# Patient Record
Sex: Male | Born: 1965 | Race: White | Hispanic: No | Marital: Married | Smoking: Never smoker
Health system: Southern US, Community
[De-identification: ages and names within clinical notes are randomized; demographics above are authoritative.]

## PROBLEM LIST (undated history)

## (undated) DIAGNOSIS — M1990 Unspecified osteoarthritis, unspecified site: Secondary | ICD-10-CM

---

## 2012-12-04 ENCOUNTER — Ambulatory Visit: Payer: Self-pay | Admitting: Internal Medicine

## 2017-02-02 ENCOUNTER — Encounter: Payer: Self-pay | Admitting: Emergency Medicine

## 2017-02-02 ENCOUNTER — Emergency Department: Payer: BLUE CROSS/BLUE SHIELD

## 2017-02-02 ENCOUNTER — Emergency Department
Admission: EM | Admit: 2017-02-02 | Discharge: 2017-02-02 | Disposition: A | Discharge status: Home / Self Care | Payer: BLUE CROSS/BLUE SHIELD | Attending: Emergency Medicine | Admitting: Emergency Medicine

## 2017-02-02 DIAGNOSIS — N5082 Scrotal pain: Secondary | ICD-10-CM | POA: Insufficient documentation

## 2017-02-02 DIAGNOSIS — N503 Cyst of epididymis: Secondary | ICD-10-CM | POA: Diagnosis not present

## 2017-02-02 DIAGNOSIS — Z79899 Other long term (current) drug therapy: Secondary | ICD-10-CM | POA: Diagnosis not present

## 2017-02-02 DIAGNOSIS — N433 Hydrocele, unspecified: Secondary | ICD-10-CM | POA: Insufficient documentation

## 2017-02-02 DIAGNOSIS — N50811 Right testicular pain: Secondary | ICD-10-CM | POA: Diagnosis present

## 2017-02-02 HISTORY — DX: Unspecified osteoarthritis, unspecified site: M19.90

## 2017-02-02 MED ORDER — CIPROFLOXACIN HCL 500 MG PO TABS: 500.0000 mg | ORAL_TABLET | Freq: Two times a day (BID) | ORAL | 0 refills | Status: AC

## 2017-02-02 MED ORDER — NAPROXEN 500 MG PO TABS: 500.0000 mg | ORAL_TABLET | Freq: Two times a day (BID) | ORAL | 0 refills | Status: AC

## 2017-02-02 NOTE — Discharge Instructions (Signed)
Your ultrasound shows an epididymal cyst, which is a small fluid collection in the tissue behind the testicle.  Follow up with urology for further evaluation. Take cipro in the meantime.  Take NSAIDS like naproxen for pain control.  It may help to use a scrotal support as well.   No results found for this or any previous visit. [place] Scrotum  Result Date: 02/02/2017 CLINICAL DATA:  Worsening right scrotal pain and swelling over the past 3 days. EXAM: SCROTAL ULTRASOUND DOPPLER ULTRASOUND OF THE TESTICLES TECHNIQUE: Complete ultrasound examination of the testicles, epididymis, and other scrotal structures was performed. Color and spectral Doppler ultrasound were also utilized to evaluate blood flow to the testicles. COMPARISON:  None. FINDINGS: Right testicle Measurements: 4.1 x 2.5 x 1.9 cm. No mass or microlithiasis visualized. Left testicle Measurements: 4.3 x 2.5 x 1.8 cm. No mass or microlithiasis visualized. Right epididymis: 5 mm cyst containing thin internal septations in the head of the epididymis on the right. Left epididymis:  Normal in size and appearance. Hydrocele:  Small right hydrocele. Varicocele:  None visualized. Pulsed Doppler interrogation of both testes demonstrates normal low resistance arterial and venous waveforms bilaterally. IMPRESSION: 1. Small right epididymal cyst. 2. Small right hydrocele. 3. Otherwise, unremarkable examination. Electronically Signed   By: Beckie SaltsSteven  Reid M.D.   On: 02/02/2017 18:03   [place] Art/ven Flow Abd Pelv Doppler  Result Date: 02/02/2017 CLINICAL DATA:  Worsening right scrotal pain and swelling over the past 3 days. EXAM: SCROTAL ULTRASOUND DOPPLER ULTRASOUND OF THE TESTICLES TECHNIQUE: Complete ultrasound examination of the testicles, epididymis, and other scrotal structures was performed. Color and spectral Doppler ultrasound were also utilized to evaluate blood flow to the testicles. COMPARISON:  None. FINDINGS: Right testicle Measurements: 4.1 x 2.5 x 1.9 cm.  No mass or microlithiasis visualized. Left testicle Measurements: 4.3 x 2.5 x 1.8 cm. No mass or microlithiasis visualized. Right epididymis: 5 mm cyst containing thin internal septations in the head of the epididymis on the right. Left epididymis:  Normal in size and appearance. Hydrocele:  Small right hydrocele. Varicocele:  None visualized. Pulsed Doppler interrogation of both testes demonstrates normal low resistance arterial and venous waveforms bilaterally. IMPRESSION: 1. Small right epididymal cyst. 2. Small right hydrocele. 3. Otherwise, unremarkable examination. Electronically Signed   By: Beckie SaltsSteven  Reid M.D.   On: 02/02/2017 18:03

## 2017-02-02 NOTE — ED Provider Notes (Signed)
[facility] Emergency Department Provider Note  ____________________________________________  Time seen: Approximately 7:05 PM  I have reviewed the triage vital signs and the nursing notes.   HISTORY  Chief Complaint Testicle Pain    HPI Derrick Carr is a 51 y.o. male who complains of right-sided scrotal pain for the past 4-5 days, radiates into the right thigh. Worse with movement. No alleviating factors. No dysuria frequency urgency hematuria or unusual discharge. No intense straining or exertion recently. No vomiting. Eating and drinking normally. No abdominal pain. Pain is sharp. Never had anything like this before.     Past Medical History:  Diagnosis Date  . Arthritis      There are no active problems to display for this patient.    History reviewed. No pertinent surgical history.   Prior to Admission medications   Medication Sig Start Date End Date Taking? Authorizing Provider  ibuprofen (ADVIL,MOTRIN) 200 MG tablet Take 800 mg by mouth 2 (two) times daily.   Yes [provider]  minocycline (MINOCIN,DYNACIN) 100 MG capsule 100 mg 2 (two) times daily.  01/22/17  Yes [provider]  omeprazole (PRILOSEC) 10 MG capsule Take 10 mg by mouth daily.   Yes [provider]  ciprofloxacin (CIPRO) 500 MG tablet Take 1 tablet (500 mg total) by mouth 2 (two) times daily. 02/02/17   Sharman CheekStafford, Jozef Eisenbeis, MD  naproxen (NAPROSYN) 500 MG tablet Take 1 tablet (500 mg total) by mouth 2 (two) times daily with a meal. 02/02/17   Sharman CheekStafford, Yakelin Grenier, MD     Allergies Sulfa antibiotics   No family history on file.  Social History Social History  Substance Use Topics  . Smoking status: Never Smoker  . Smokeless tobacco: Never Used  . Alcohol use Yes    Review of Systems  Constitutional:   No fever or chills.  ENT:   No sore throat. No rhinorrhea. Cardiovascular:   No chest pain or syncope. Respiratory:   No dyspnea or  cough. Gastrointestinal:   Negative for abdominal pain, vomiting and diarrhea.  Musculoskeletal:   Negative for focal pain or swelling All other systems reviewed and are negative except as documented above in ROS and HPI.  ____________________________________________   PHYSICAL EXAM:  VITAL SIGNS: ED Triage Vitals  Enc Vitals Group     BP 02/02/17 1616 137/87     Pulse Rate 02/02/17 1616 60     Resp 02/02/17 1616 18     Temp 02/02/17 1616 98.3 F (36.8 C)     Temp Source 02/02/17 1616 Oral     SpO2 02/02/17 1616 96 %     Weight 02/02/17 1618 250 lb (113.4 kg)     Height 02/02/17 1618 6\' 2"  (1.88 m)     Head Circumference --      Peak Flow --      Pain Score 02/02/17 1618 0     Pain Loc --      Pain Edu? --      Excl. in GC? --     Vital signs reviewed, nursing assessments reviewed.   Constitutional:   Alert and oriented. Well appearing and in no distress. Eyes:   No scleral icterus.  EOMI. No nystagmus. No conjunctival pallor. PERRL. ENT   Head:   Normocephalic and atraumatic.   Nose:   No congestion/rhinnorhea.    Mouth/Throat:   MMM, no pharyngeal erythema. No peritonsillar mass.    Neck:   No meningismus. Full ROM Hematological/Lymphatic/Immunilogical:  No cervical lymphadenopathy.No inguinal lymphadenopathy Cardiovascular:   RRR. Symmetric bilateral radial and DP pulses.  No murmurs.  Respiratory:   Normal respiratory effort without tachypnea/retractions. Breath sounds are clear and equal bilaterally. No wheezes/rales/rhonchi. Gastrointestinal:   Soft and nontender. Non distended. There is no CVA tenderness.  No rebound, rigidity, or guarding. Genitourinary:   Normal penis, no discharge. No lesions. Left scrotum and testicle unremarkable. Right scrotum with a palpable dense structure in the area of the epididymis, feels adherent to the testicle. This area is tender to the touch as well. No large tubular structure proximally to suggest bowel  herniation. Musculoskeletal:   Normal range of motion in all extremities. No joint effusions.  No lower extremity tenderness.  No edema. Neurologic:   Normal speech and language.  Motor grossly intact. No gross focal neurologic deficits are appreciated.  Skin:    Skin is warm, dry and intact. No rash noted.  No petechiae, purpura, or bullae.  ____________________________________________    LABS (pertinent positives/negatives) (all labs ordered are listed, but only abnormal results are displayed) Labs Reviewed - No data to display ____________________________________________   EKG    ____________________________________________    RADIOLOGY  US Scrotum  Result Date: 02/02/2017 CLINICAL DATA:  Worsening right scrotal pain and swelling over the past 3 days. EXAM: SCROTAL ULTRASOUND DOPPLER ULTRASOUND OF THE TESTICLES TECHNIQUE: Complete ultrasound examination of the testicles, epididymis, and other scrotal structures was performed. Color and spectral Doppler ultrasound were also utilized to evaluate blood flow to the testicles. COMPARISON:  None. FINDINGS: Right testicle Measurements: 4.1 x 2.5 x 1.9 cm. No mass or microlithiasis visualized. Left testicle Measurements: 4.3 x 2.5 x 1.8 cm. No mass or microlithiasis visualized. Right epididymis: 5 mm cyst containing thin internal septations in the head of the epididymis on the right. Left epididymis:  Normal in size and appearance. Hydrocele:  Small right hydrocele. Varicocele:  None visualized. Pulsed Doppler interrogation of both testes demonstrates normal low resistance arterial and venous waveforms bilaterally. IMPRESSION: 1. Small right epididymal cyst. 2. Small right hydrocele. 3. Otherwise, unremarkable examination. Electronically Signed   By: Beckie Salts M.D.   On: 02/02/2017 18:03   [place] Art/ven Flow Abd Pelv Doppler  Result Date: 02/02/2017 CLINICAL DATA:  Worsening right scrotal pain and swelling over the past 3 days. EXAM:  SCROTAL ULTRASOUND DOPPLER ULTRASOUND OF THE TESTICLES TECHNIQUE: Complete ultrasound examination of the testicles, epididymis, and other scrotal structures was performed. Color and spectral Doppler ultrasound were also utilized to evaluate blood flow to the testicles. COMPARISON:  None. FINDINGS: Right testicle Measurements: 4.1 x 2.5 x 1.9 cm. No mass or microlithiasis visualized. Left testicle Measurements: 4.3 x 2.5 x 1.8 cm. No mass or microlithiasis visualized. Right epididymis: 5 mm cyst containing thin internal septations in the head of the epididymis on the right. Left epididymis:  Normal in size and appearance. Hydrocele:  Small right hydrocele. Varicocele:  None visualized. Pulsed Doppler interrogation of both testes demonstrates normal low resistance arterial and venous waveforms bilaterally. IMPRESSION: 1. Small right epididymal cyst. 2. Small right hydrocele. 3. Otherwise, unremarkable examination. Electronically Signed   By: Beckie Salts M.D.   On: 02/02/2017 18:03    ____________________________________________   PROCEDURES Procedures  ____________________________________________   INITIAL IMPRESSION / ASSESSMENT AND PLAN / ED COURSE  Pertinent labs & imaging results that were available during my care of the patient were reviewed by me and considered in my medical decision making (see chart for details).  Patient presents to  scrotal pain. Ultrasound consistent with epididymal cysts. I'll start the patient on Cipro and have him follow-up with urology. No evidence of torsion or trauma. No abscess or herniation. Suitable for discharge home and outpatient follow-up. NSAIDs for pain.      ____________________________________________   FINAL CLINICAL IMPRESSION(S) / ED DIAGNOSES  Final diagnoses:  Epididymal cyst  Scrotal pain      New Prescriptions   CIPROFLOXACIN (CIPRO) 500 MG TABLET    Take 1 tablet (500 mg total) by mouth 2 (two) times daily.   NAPROXEN (NAPROSYN)  500 MG TABLET    Take 1 tablet (500 mg total) by mouth 2 (two) times daily with a meal.     Portions of this note were generated with dragon dictation software. Dictation errors may occur despite best attempts at proofreading.    Sharman Cheek, MD 02/02/17 [phone]

## 2017-02-02 NOTE — ED Notes (Addendum)
Pt reports that he has pain and swelling in the right side of his scrotum - the pain started last Thursday but today he noticed increased pain that radiates into bend of right leg and groin so he came to the er - pt denies any injury - the pain comes and goes - at this time he is in no pain

## 2017-02-02 NOTE — ED Triage Notes (Signed)
Pt reports scrotal swelling and pain that began Thursday last week.

## 2017-12-25 IMAGING — US US ART/VEN ABD/PELV/SCROTUM DOPPLER LTD
1 series · 14 of 25 positions shown · non-contrast
Comparison: None.

CLINICAL DATA: Worsening right scrotal pain and swelling over the
past 3 days.

EXAM:
SCROTAL ULTRASOUND
DOPPLER ULTRASOUND OF THE TESTICLES
TECHNIQUE: Complete ultrasound examination of the testicles, epididymis, and
other scrotal structures was performed. Color and spectral Doppler
ultrasound were also utilized to evaluate blood flow to the
testicles.

[Series 1: us art/ven abd/pelv/scrotum doppler ltd · 0.08mm/px · 50 acquisitions, 14 frames shown]
[im 1/50]
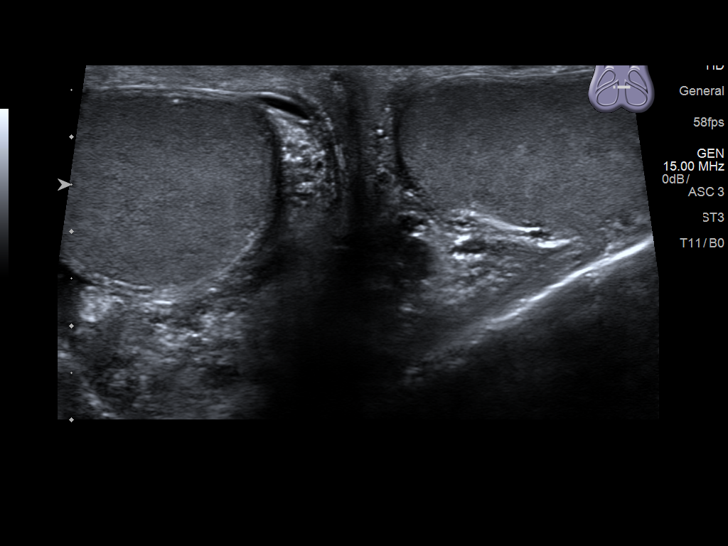
[im 5/50]
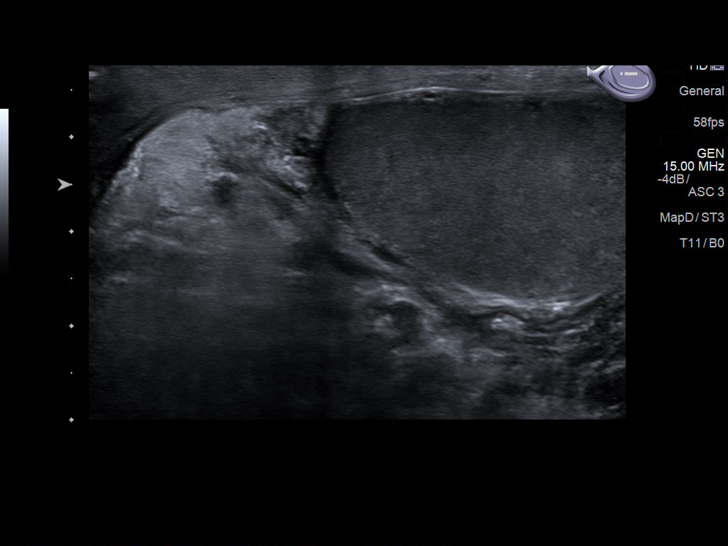
[im 9/50]
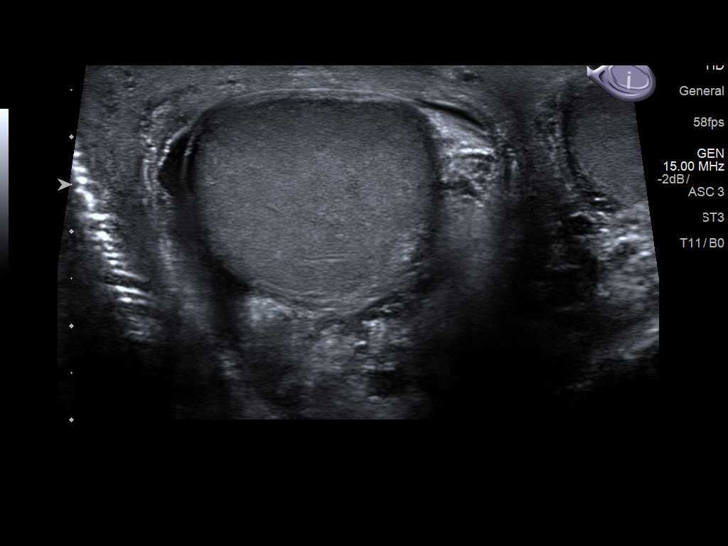
[im 13/50]
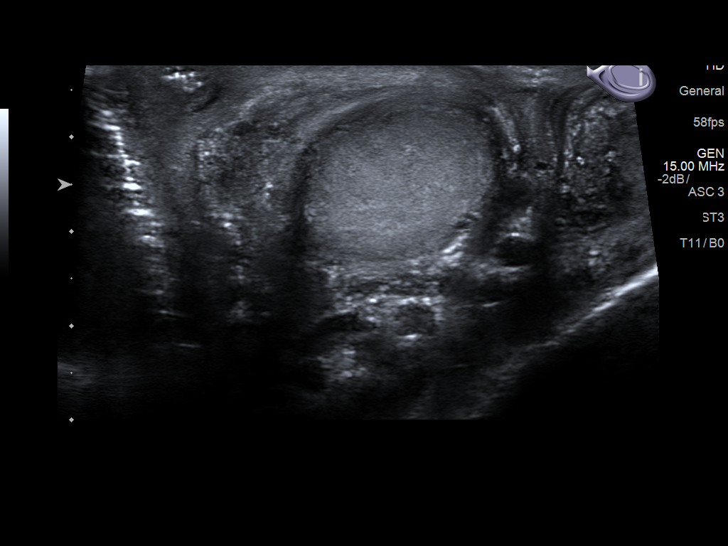
[im 17/50]
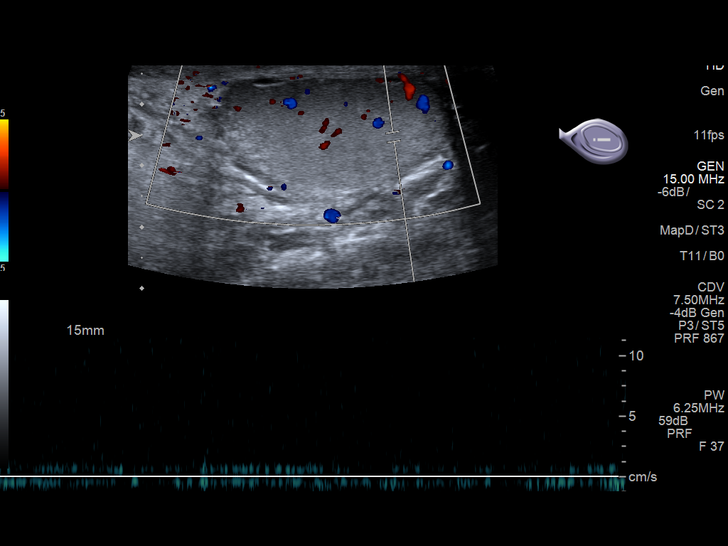
[im 19/50]
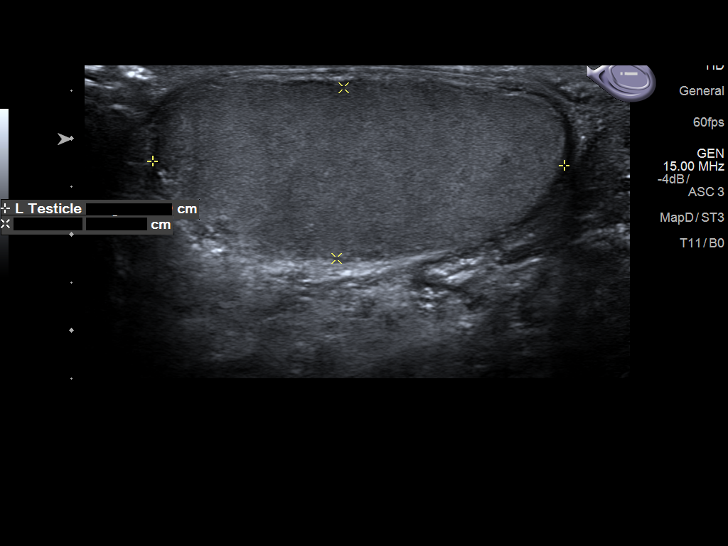
[im 23/50]
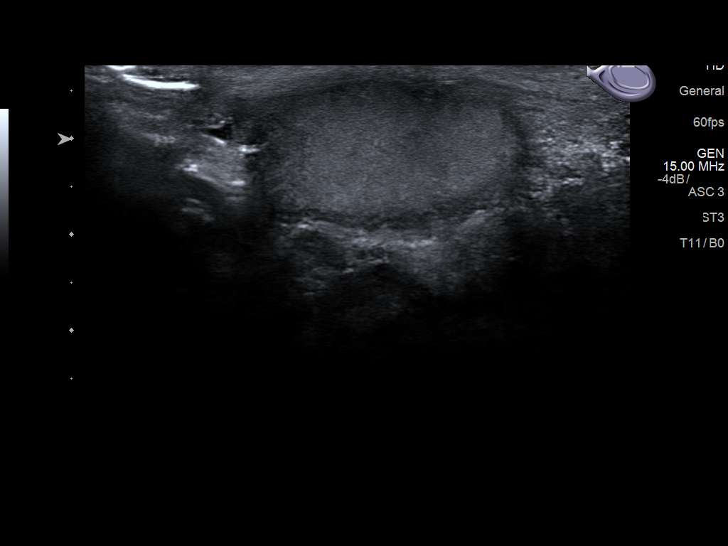
[im 27/50]
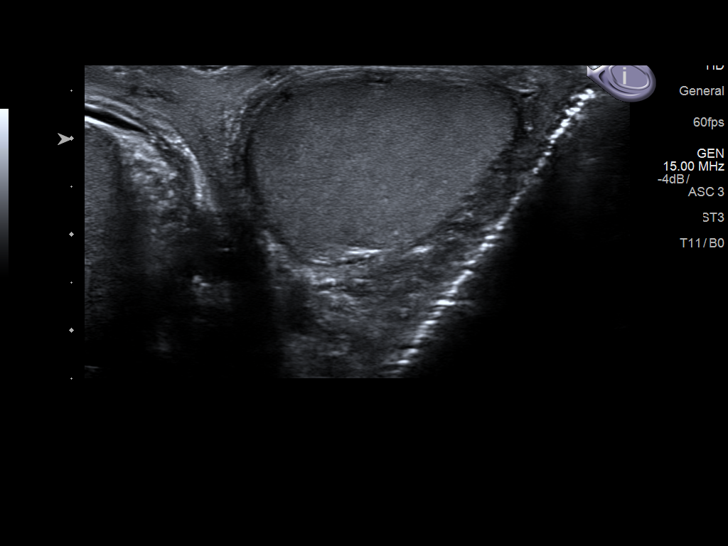
[im 31/50]
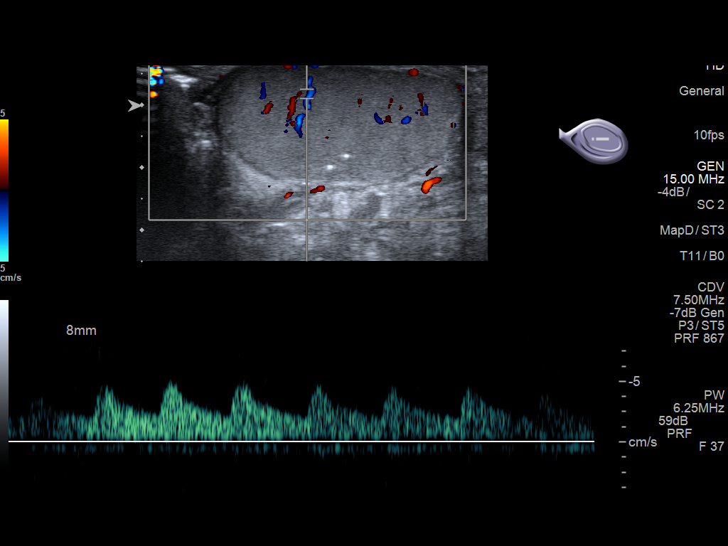
[im 33/50]
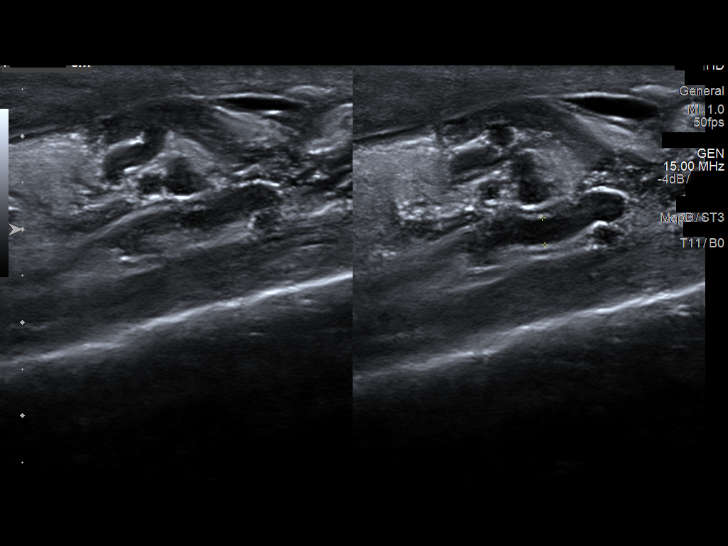
[im 37/50]
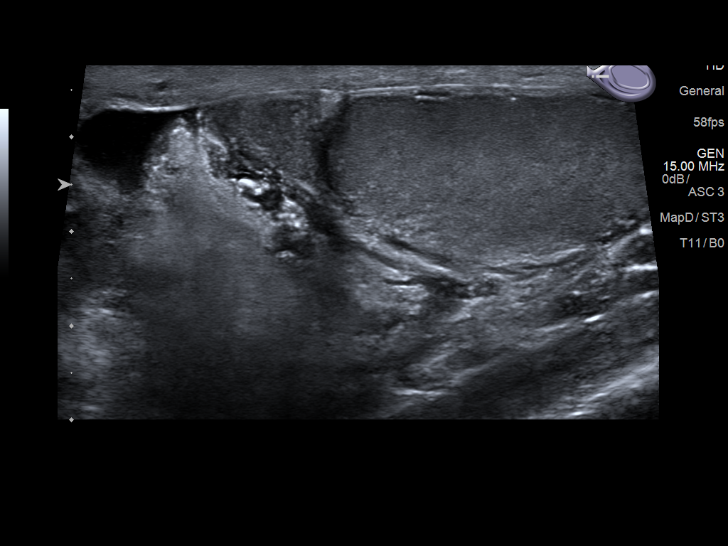
[im 41/50]
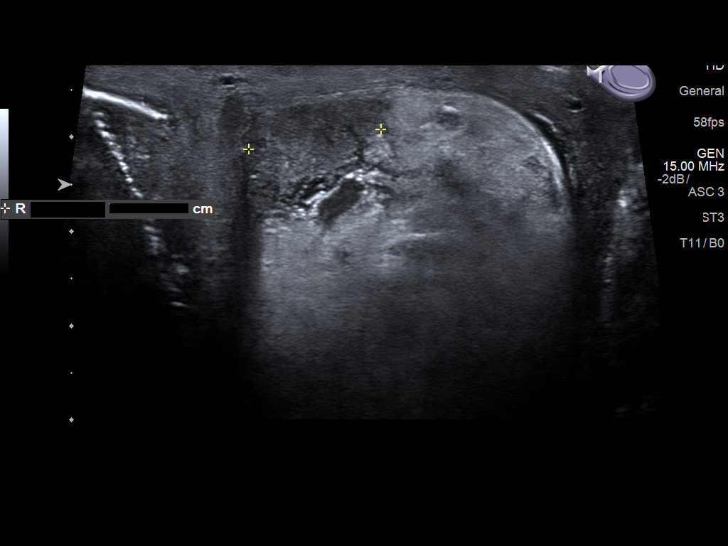
[im 45/50]
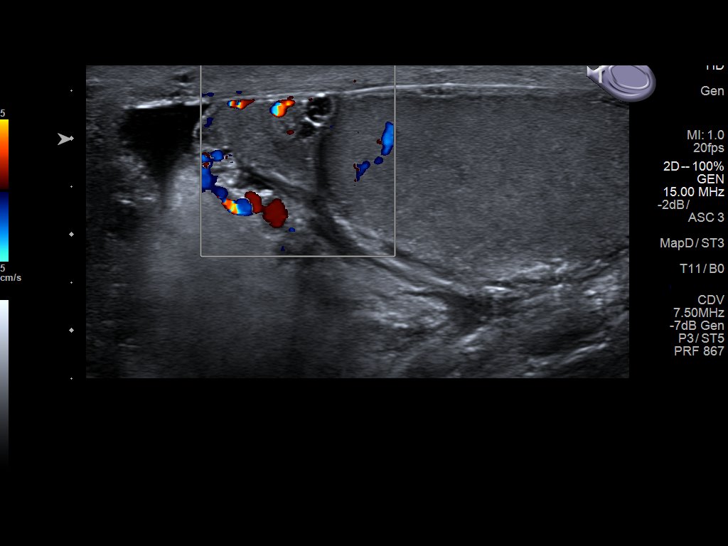
[im 50/50]
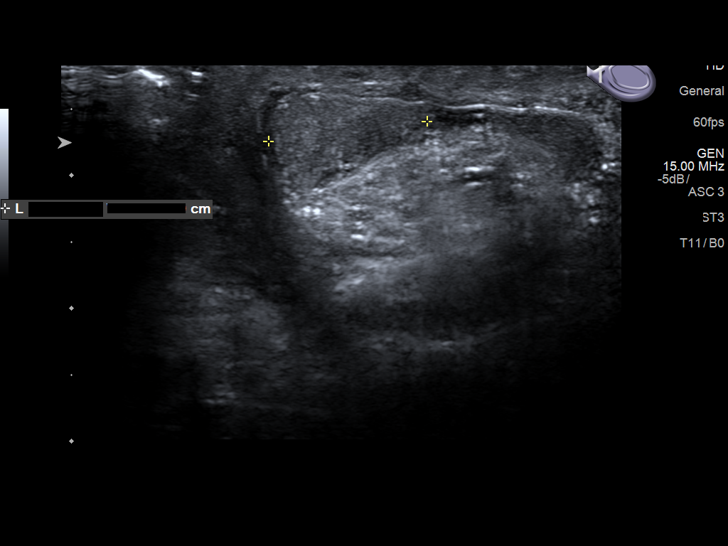

[14 of 25 positions shown; findings below may reference images not displayed]

FINDINGS: Right testicle

Measurements: 4.1 x 2.5 x 1.9 cm. No mass or microlithiasis
visualized.

Left testicle

Measurements: 4.3 x 2.5 x 1.8 cm. No mass or microlithiasis
visualized.

Right epididymis: 5 mm cyst containing thin internal septations in
the head of the epididymis on the right.

Left epididymis:  Normal in size and appearance.

Hydrocele:  Small right hydrocele.

Varicocele:  None visualized.

Pulsed Doppler interrogation of both testes demonstrates normal low
resistance arterial and venous waveforms bilaterally.
IMPRESSION: 1. Small right epididymal cyst.
2. Small right hydrocele.
3. Otherwise, unremarkable examination.
